# Patient Record
Sex: Female | Born: 1995 | Race: White | Hispanic: No | Marital: Single | Smoking: Never smoker
Health system: Southern US, Community
[De-identification: ages and names within clinical notes are randomized; demographics above are authoritative.]

---
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
## 2015-04-23 ENCOUNTER — Encounter: Payer: Self-pay | Admitting: Family Medicine

## 2015-04-23 ENCOUNTER — Ambulatory Visit (INDEPENDENT_AMBULATORY_CARE_PROVIDER_SITE_OTHER): Payer: Self-pay | Admitting: Family Medicine

## 2015-04-23 VITALS — BP 133/80 | HR 62 | Temp 98.1°F | Resp 14

## 2015-04-23 DIAGNOSIS — T148 Other injury of unspecified body region: Secondary | ICD-10-CM

## 2015-04-23 DIAGNOSIS — W57XXXA Bitten or stung by nonvenomous insect and other nonvenomous arthropods, initial encounter: Secondary | ICD-10-CM

## 2015-04-23 DIAGNOSIS — L089 Local infection of the skin and subcutaneous tissue, unspecified: Secondary | ICD-10-CM

## 2015-04-23 MED ORDER — SULFAMETHOXAZOLE-TRIMETHOPRIM 800-160 MG PO TABS: 1.0000 | ORAL_TABLET | Freq: Two times a day (BID) | ORAL | Status: DC

## 2015-04-23 NOTE — Progress Notes (Signed)
Patient ID: Katie InchAlexis Cortez, female   DOB: 10/11/1995, 20 y.o.   MRN: [id number]  Patient presents today with several erythematous circular slightly tender itchy areas on right lower extremity greater than left lower extremity. Patient noticed these lesions yesterday with when she got up out of bed. She is unsure whether they were all they are yesterday. She denies any other lesions anywhere else on her body. She denies any history of MRSA that she knows of. She admits to sleeping in her bed only. She denies any fever or chills. She has been applying hydrocortisone to the area and covering them up with bandages. She denies any significant drainage from the areas or streaks.  ROS: Negative except mentioned above.  Vitals as per Epic.  GENERAL: NAD HEENT: no pharyngeal erythema, no exudate, no erythema of TMs, no cervical LAD RESP: CTA B CARD: RRR SKIN:no bigger then a dime sized erythematous slightly raised lesions on right lower extremity (8-10), similar lesions on left lower extremity (2-3), not fluctuant, no streaks, minimal drainage on one bandage she had on  NEURO: CN II-XII grossly intact   A/P: Skin infection-possibly initially related to insect bites, will treat with Bactrim DS for a week, keep area clean and dry, if any drainage patient is to refrain from doing any contact athletic activity, she is to avoid being in the weight room for now until the areas are healed over, take Claritin by mouth when necessary for itching, if any worsening symptoms she is to seek medical attention as discussed.

## 2015-05-17 ENCOUNTER — Encounter: Payer: Self-pay | Admitting: Family Medicine

## 2015-05-17 ENCOUNTER — Ambulatory Visit (INDEPENDENT_AMBULATORY_CARE_PROVIDER_SITE_OTHER): Payer: Self-pay | Admitting: Family Medicine

## 2015-05-17 VITALS — BP 138/91 | HR 80 | Temp 98.4°F | Resp 14

## 2015-05-17 DIAGNOSIS — A499 Bacterial infection, unspecified: Secondary | ICD-10-CM

## 2015-05-17 DIAGNOSIS — N63 Unspecified lump in breast: Secondary | ICD-10-CM

## 2015-05-17 NOTE — Progress Notes (Signed)
Patient ID: Katie InchAlexis Cortez, female   DOB: 08/25/1995, 20 y.o.   MRN: [id number]  Patient presents today with symptoms of feeling a pea-sized mass in her right breast. Patient states that she just noticed this yesterday. She has been noticing for the last day or 2 some discharge from the piercing she has on her right nipple. This is not a new piercing. She denies any fever or chills. She denies any history of MRSA. She states that her maternal grandmother did have breast cancer. Her last menstrual period was 3 weeks ago. She denies any chance of pregnancy.  ROS: Negative except mentioned above. Vitals as per epic.  GENERAL: NAD RESP: CTA B CARD: RRR BREASTS: pea sized mobile mass noted below nipple at 6 o'clock, minimal bloody crusting at medial end of piercing of nipple, no other masses felt, no axillary LAD appreciated  NEURO: CN II-XII grossly intact   A/P: R Breast Mass- the mass could be related to the current infection that she has, I have asked that she take out the piercing and start Bactrim DS for 10 days, if the area is still present after 10 days and would recommend doing a mammogram and then ultrasound. Patient addresses understanding and will follow-up in 10 days. She will follow up sooner if there are worsening symptoms.

## 2015-09-24 ENCOUNTER — Encounter: Payer: Self-pay | Admitting: Family Medicine

## 2015-09-24 ENCOUNTER — Ambulatory Visit (INDEPENDENT_AMBULATORY_CARE_PROVIDER_SITE_OTHER): Payer: 59 | Admitting: Family Medicine

## 2015-09-24 VITALS — Temp 98.2°F

## 2015-09-24 DIAGNOSIS — L089 Local infection of the skin and subcutaneous tissue, unspecified: Secondary | ICD-10-CM

## 2015-09-24 MED ORDER — SULFAMETHOXAZOLE-TRIMETHOPRIM 800-160 MG PO TABS: 1.0000 | ORAL_TABLET | Freq: Two times a day (BID) | ORAL | 0 refills | Status: AC

## 2015-09-24 NOTE — Progress Notes (Signed)
Patient presents today with small red lesions on her legs. They're itchy at times. She states some of them do have white heads with pus at times. She denies any history of MRSA in the past. She denies any other lesions anywhere else. She has been using hydrocortisone on the areas if they become itchy. Denies any tick bites recently. Denies any joint pain, fever, headache, malaise.  ROS: Negative except mentioned above.  Vitals as per Epic.  GENERAL: NAD HEENT: no pharyngeal erythema, no exudate, no erythema of TMs, no cervical LAD RESP: CTA B CARD: RRR SKIN: small erythematous raised lesions on lower legs only, no discharge from the site at this time, no streaks NEURO: CN II-XII grossly intact   A/P: Skin Infection- discussed with patient that I would recommend taking oral antibiotic such as Bactrim DS, encourage patient not to shave legs until areas have all healed, once she goes back to shaving she should use a new razor, wash skin with antibacterial soap, avoid using weight room equipment and whirlpool until areas are healed, wash linens in hot water, does not wear any special equipment on legs, seek medical attention if symptoms persist or worsen as discussed.

## 2016-02-28 ENCOUNTER — Inpatient Hospital Stay: Admission: RE | Admit: 2016-02-28 | Payer: Self-pay | Source: Ambulatory Visit

## 2016-02-28 ENCOUNTER — Ambulatory Visit
Admission: RE | Admit: 2016-02-28 | Discharge: 2016-02-28 | Disposition: A | Discharge status: Home / Self Care | Payer: BLUE CROSS/BLUE SHIELD | Source: Ambulatory Visit | Attending: Family Medicine | Admitting: Family Medicine

## 2016-02-28 ENCOUNTER — Ambulatory Visit (INDEPENDENT_AMBULATORY_CARE_PROVIDER_SITE_OTHER): Payer: BLUE CROSS/BLUE SHIELD | Admitting: Family Medicine

## 2016-02-28 ENCOUNTER — Encounter: Payer: Self-pay | Admitting: Family Medicine

## 2016-02-28 DIAGNOSIS — M79671 Pain in right foot: Secondary | ICD-10-CM | POA: Insufficient documentation

## 2016-02-28 NOTE — Progress Notes (Signed)
Patient presents today with symptoms of right foot pain. Patient states that she's had the symptoms for the last 2 days. She denies any particular injury when the symptoms started. She denies any pain previous to feeling it 2 days ago. She denies any bruising or swelling of the area. She denies any shoe/cleet issues. She does not wear orthotics. She denies any history of stress injury or stress fracture in the past. She admits her menstrual cycles are normal. She denies any restrictions in her diet. She denies any significant weight gain or weight loss. She was able to practice yesterday with no increase in discomfort. She is able to walk without constant pain or having a limp.  ROS: Negative except mentioned above. Vitals as per Epic.  GENERAL: NAD MSK: Right Foot - no obvious deformity, no ecchymosis or swelling appreciated, mild tenderness at the cuboid/base of fifth metatarsal, full range of motion, negative hop test, normal gait NEURO: CN II-XII grossly intact   A/P: Right foot pain - will do x-rays initially, would recommend being in walking boot outside of activity, I have discussed with the trainer that I would like her to limit the running that she is doing and modify anything else that is causing her discomfort, if her symptoms persist/worsen would suggest further imaging with MRI, patient addresses understanding of this and will voice her symptoms on a daily basis to her trainer. NSAIDs when necessary with food. Seek medical attention if symptoms persist or worsen as discussed.

## 2017-09-18 IMAGING — CR DG FOOT COMPLETE 3+V*R*
1 series · 3 of 3 positions shown · non-contrast
Comparison: None.

CLINICAL DATA: Right lateral foot pain.  No known injury.

EXAM:
RIGHT FOOT COMPLETE - 3+ VIEW

[Series 1: dg foot complete right · 0.14mm/px · 3 of 3 slices shown]
[im 1/3]
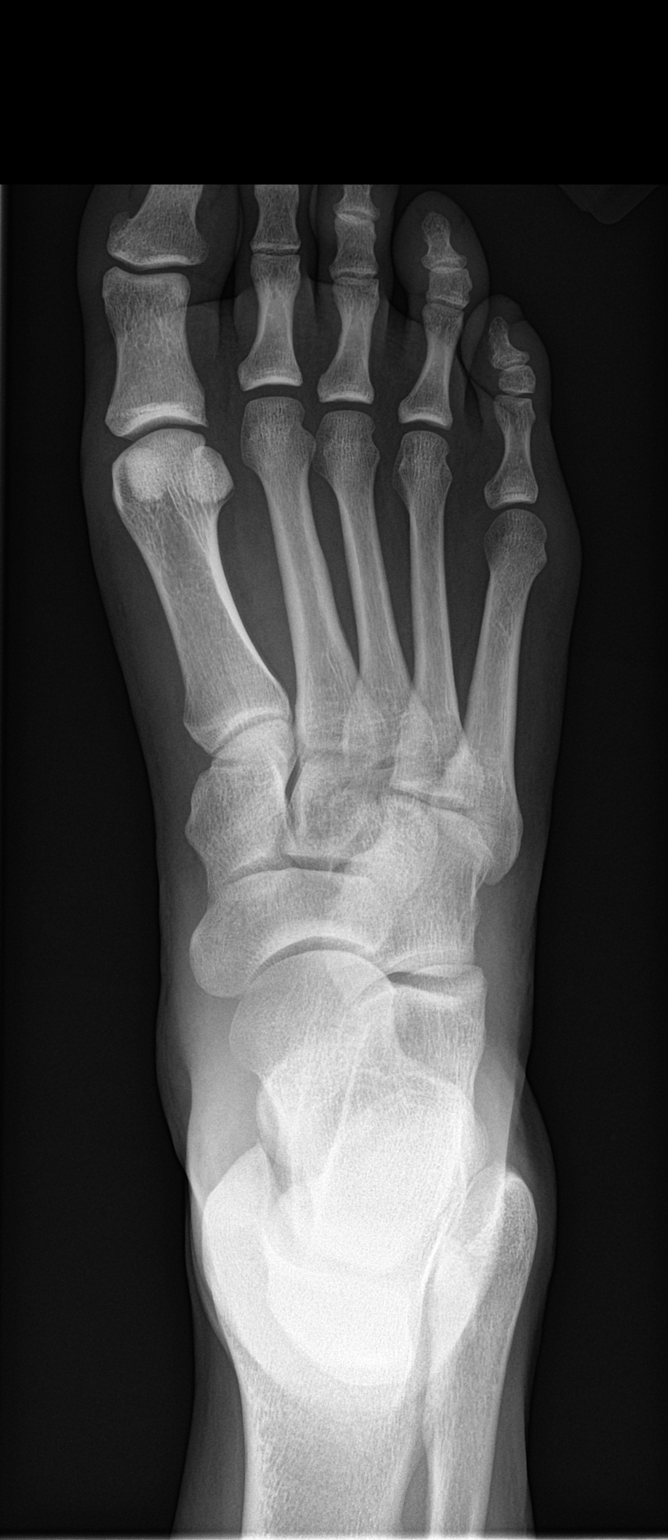
[im 2/3]
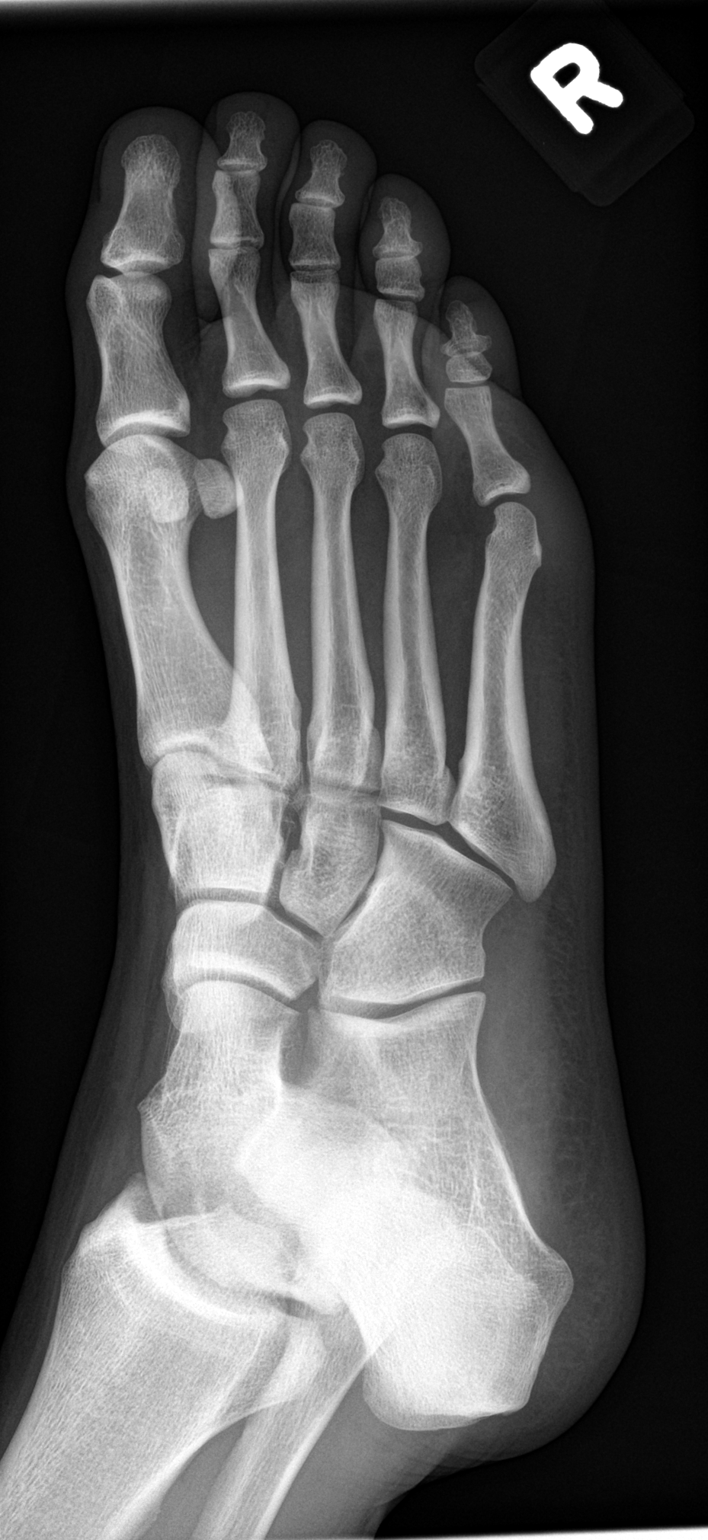
[im 3/3]
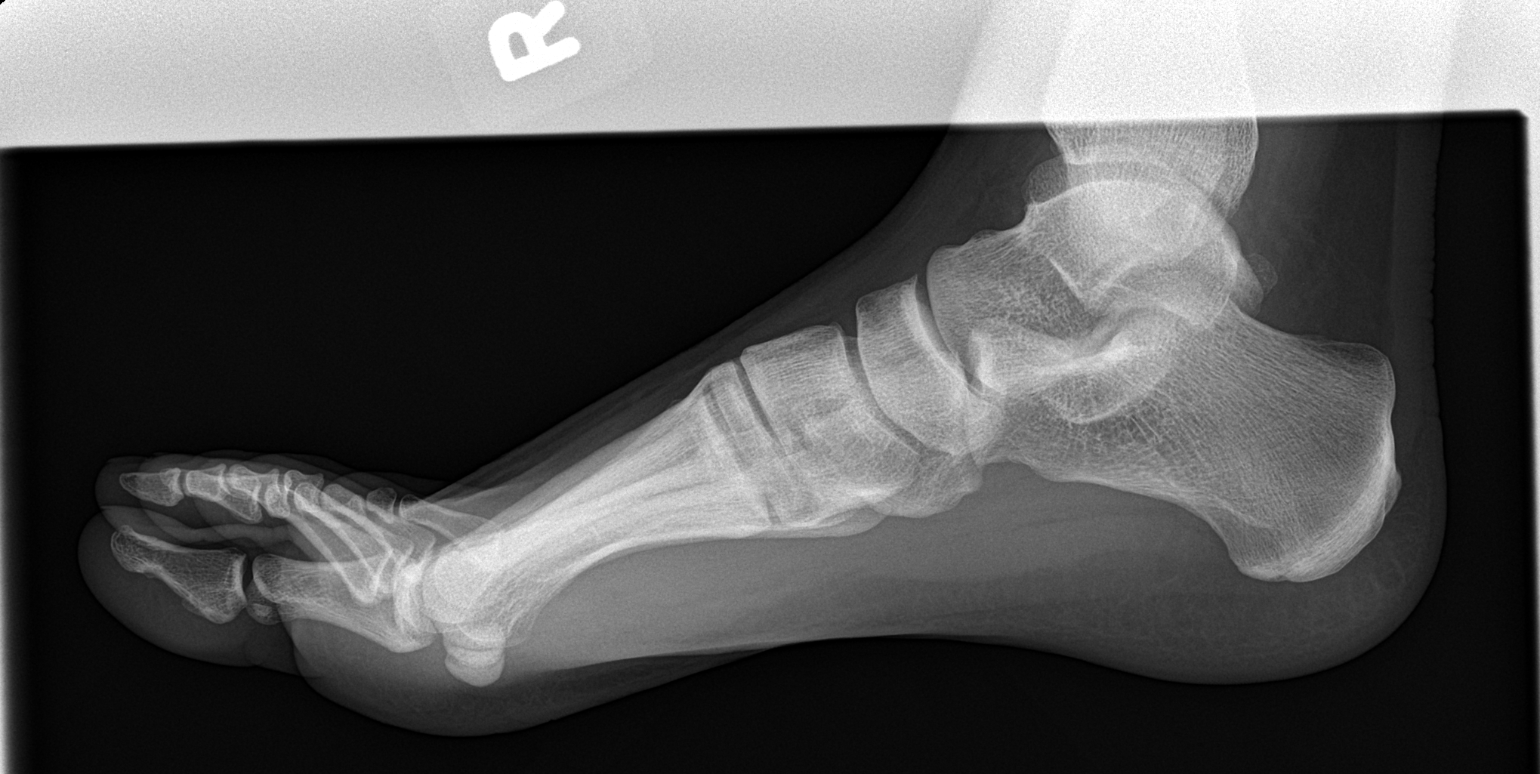

[3 of 3 positions shown; findings below may reference images not displayed]

FINDINGS: There is no evidence of fracture or dislocation. There is no
evidence of arthropathy or other focal bone abnormality. Soft
tissues are unremarkable.
IMPRESSION: Negative.
